# Patient Record
Sex: Female | Born: 1995 | Race: Black or African American | Hispanic: No | Marital: Single | Smoking: Never smoker
Health system: Southern US, Community
[De-identification: ages and names within clinical notes are randomized; demographics above are authoritative.]

## PROBLEM LIST (undated history)

## (undated) ENCOUNTER — Inpatient Hospital Stay (HOSPITAL_COMMUNITY): Payer: Self-pay

## (undated) DIAGNOSIS — J45909 Unspecified asthma, uncomplicated: Secondary | ICD-10-CM

## (undated) DIAGNOSIS — D649 Anemia, unspecified: Secondary | ICD-10-CM

## (undated) HISTORY — PX: TONSILLECTOMY: SUR1361

---
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
## 2015-04-25 ENCOUNTER — Encounter (HOSPITAL_COMMUNITY): Payer: Self-pay | Admitting: *Deleted

## 2016-09-19 IMAGING — US US OB COMP LESS 14 WK
1 series · 14 of 28 positions shown · non-contrast
Comparison: None.

CLINICAL DATA: Pregnant patient with vaginal spotting and cramping
for 2 weeks.

EXAM:
OBSTETRIC <14 WK US AND TRANSVAGINAL OB US
TECHNIQUE: Both transabdominal and transvaginal ultrasound examinations were
performed for complete evaluation of the gestation as well as the
maternal uterus, adnexal regions, and pelvic cul-de-sac.
Transvaginal technique was performed to assess early pregnancy.

[Series 1: us ob comp less 14 wk · 14 of 39 slices shown]
[im 2/39]
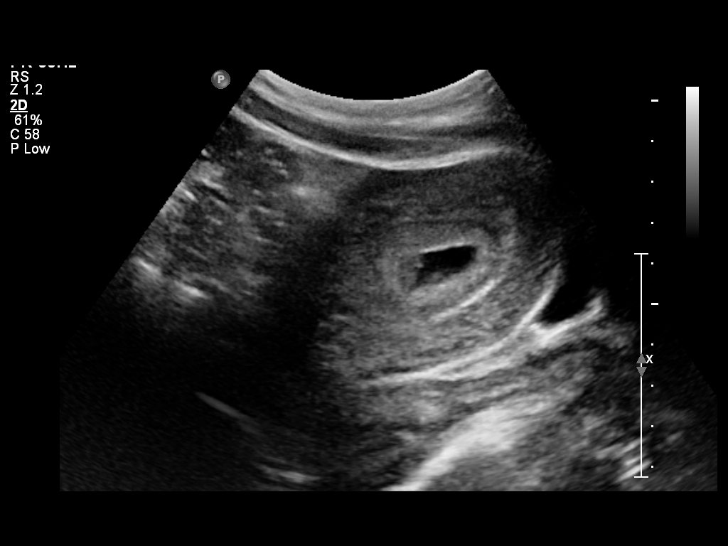
[im 5/39]
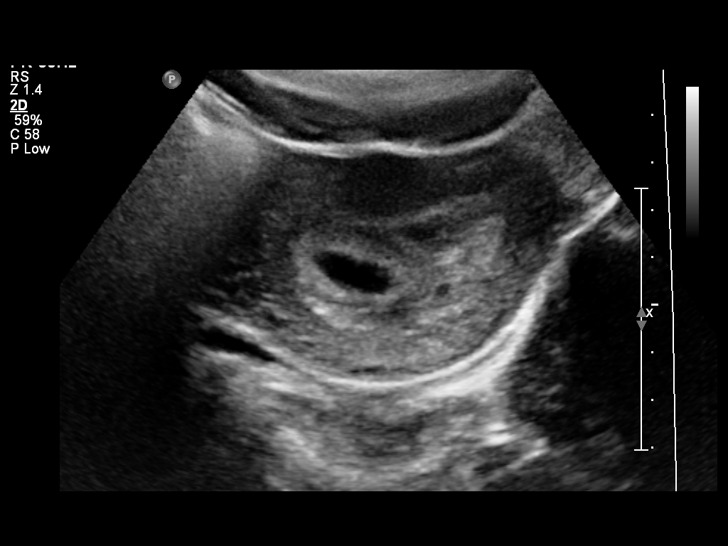
[im 8/39]
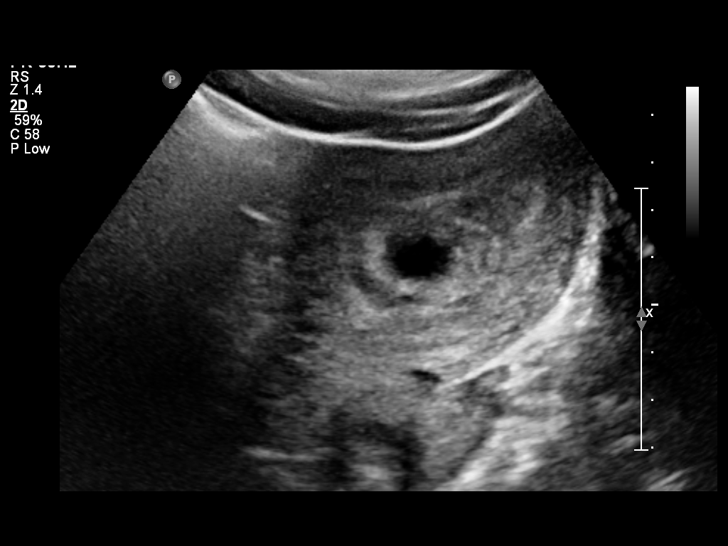
[im 10/39]
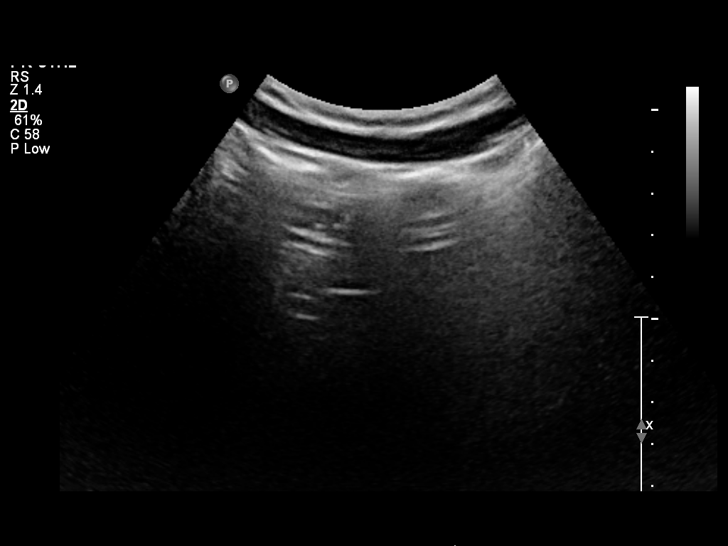
[im 13/39]
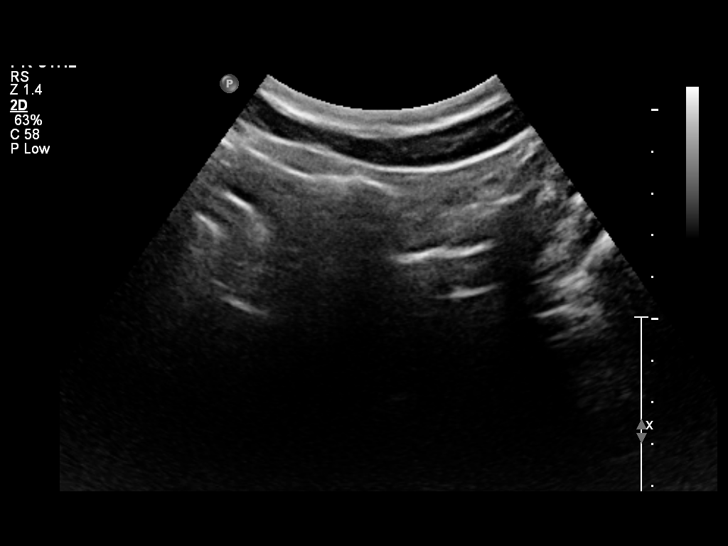
[im 16/39]
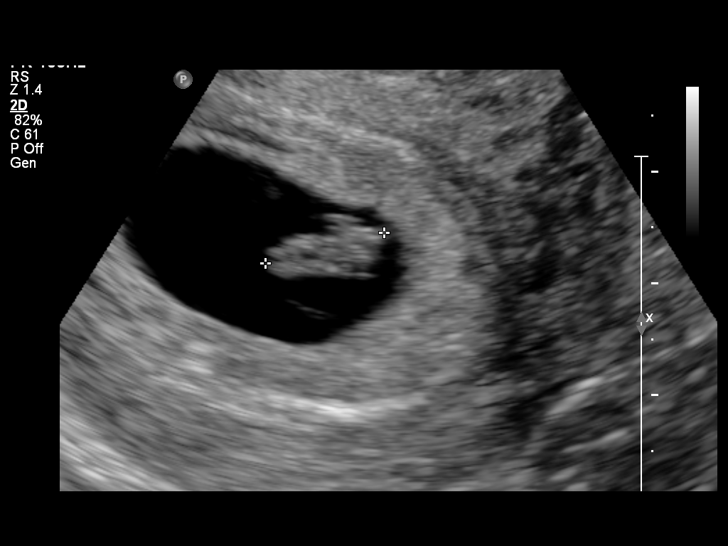
[im 19/39]
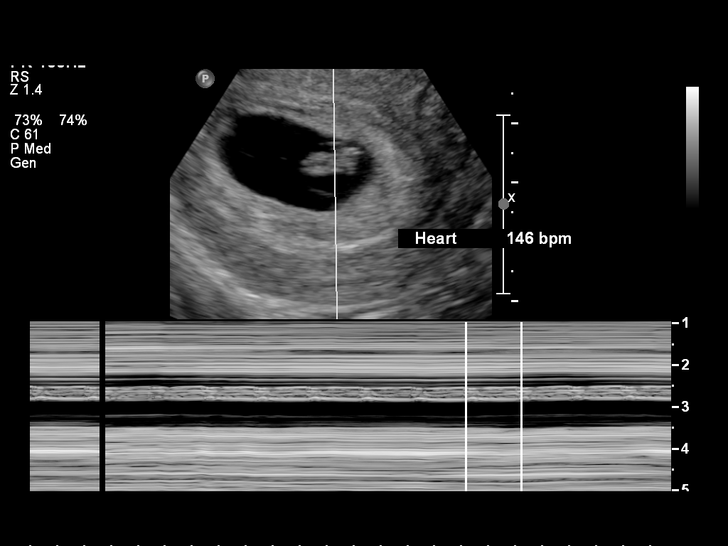
[im 22/39]
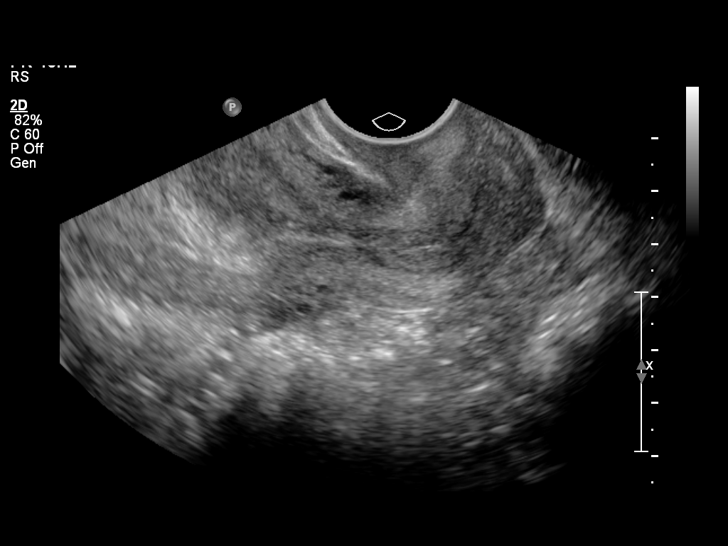
[im 24/39]
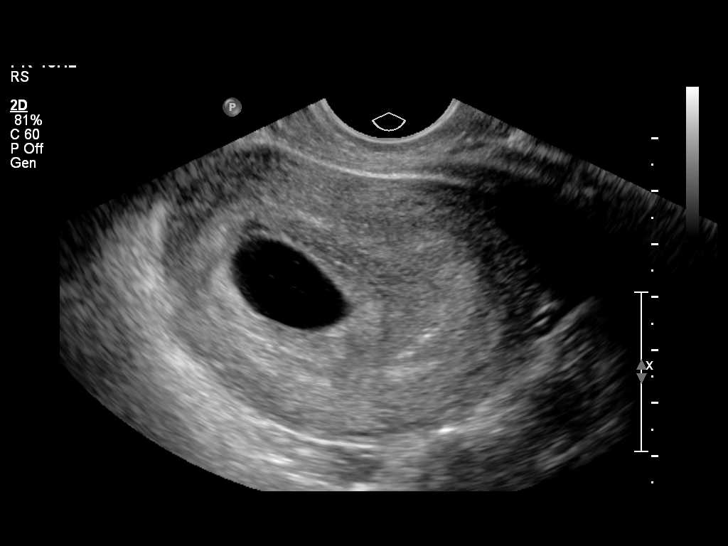
[im 27/39]
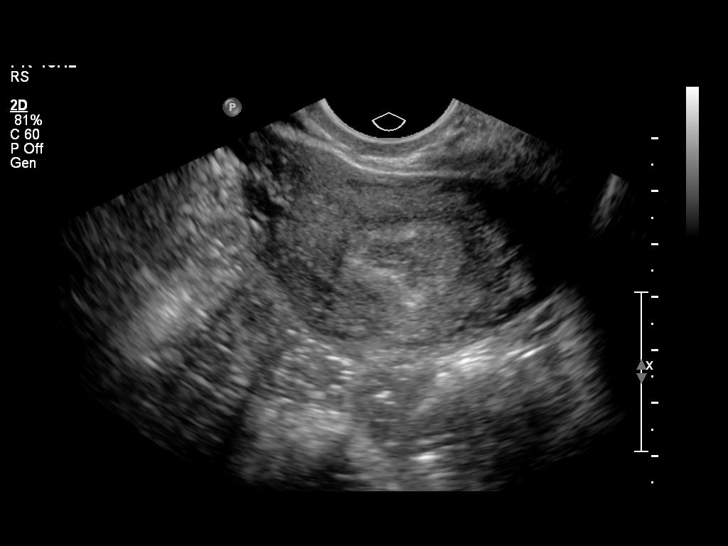
[im 30/39]
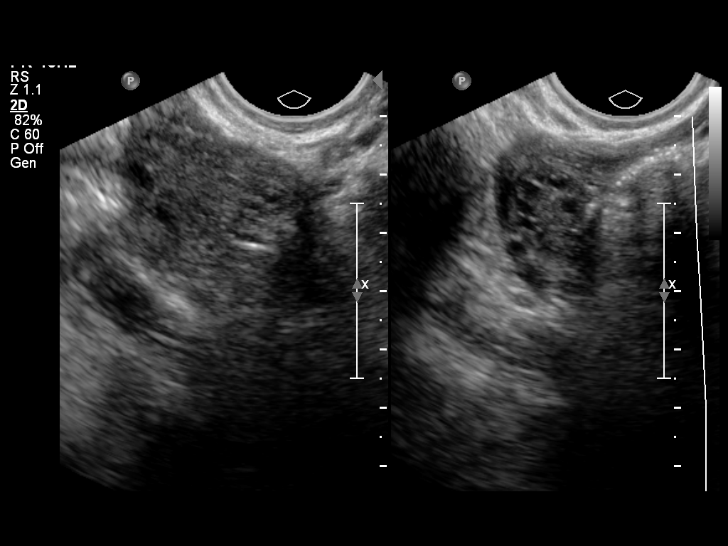
[im 33/39]
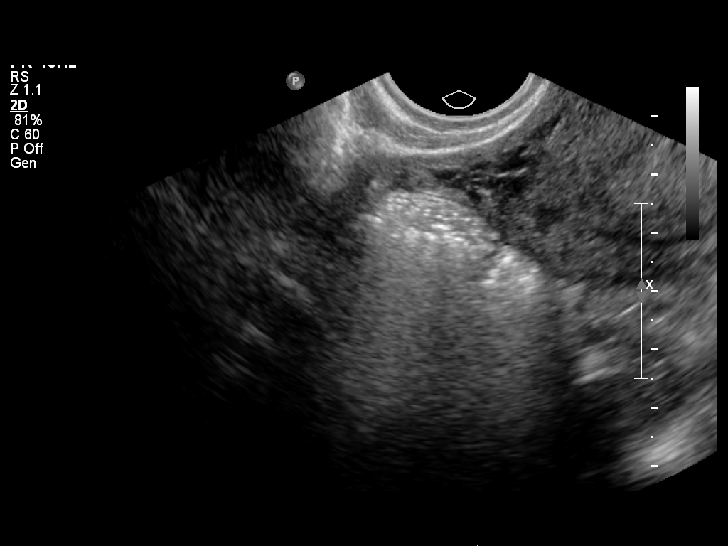
[im 36/39]
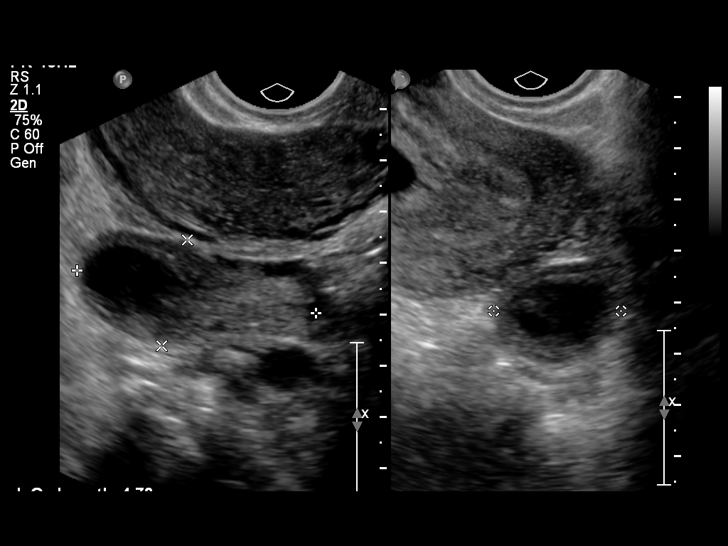
[im 39/39]
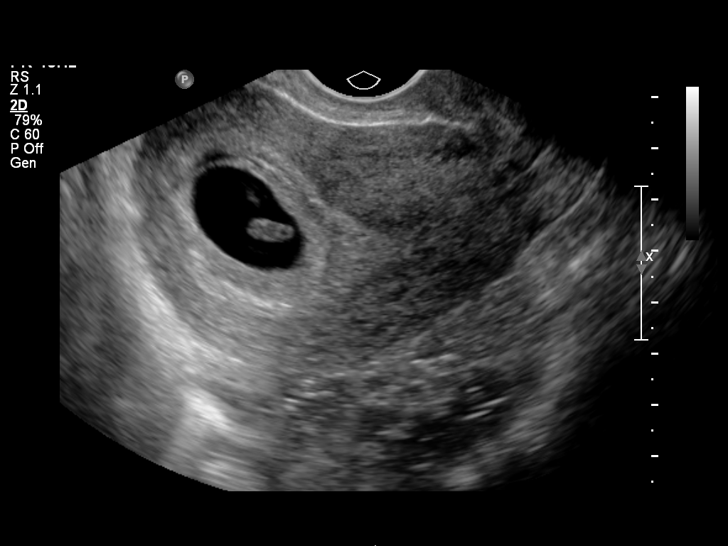

[14 of 28 positions shown; findings below may reference images not displayed]

FINDINGS: Intrauterine gestational sac: Visualized/normal in shape.

Yolk sac:  Present.

Embryo:  Present.

Cardiac Activity: Present.

Heart Rate: 146  bpm

CRL:  11.1  mm   7 w   2 d                  US EDC: 02/04/2015

Maternal uterus/adnexae: No subchorionic hemorrhage. Right ovary
measures 3.1 x 2.3 x 1.5 cm with blood flow and is normal. Left
ovary measures 4.7 x 2.1 x 2.5 cm with blood flow, there is a corpus
luteal cyst. No pelvic free fluid.
IMPRESSION: Single live intrauterine pregnancy estimated gestational age 7 weeks
2 days for estimated date of delivery 02/04/2015.

## 2016-10-27 IMAGING — US US MFM FETAL NUCHAL TRANSLUCENCY
1 series · 13 of 28 positions shown · non-contrast
Comparison: none

[Series 1: us mfm fetal nuchal translucency · 0.07mm/px · 13 of 37 slices shown]
[im 2/37]
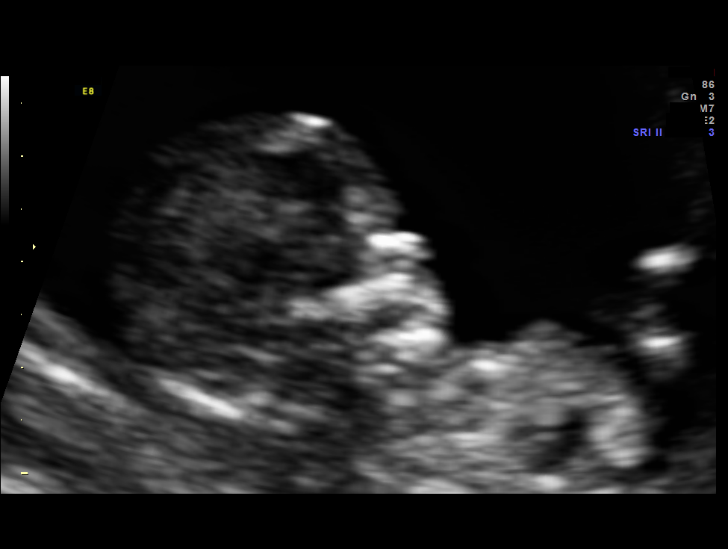
[im 5/37]
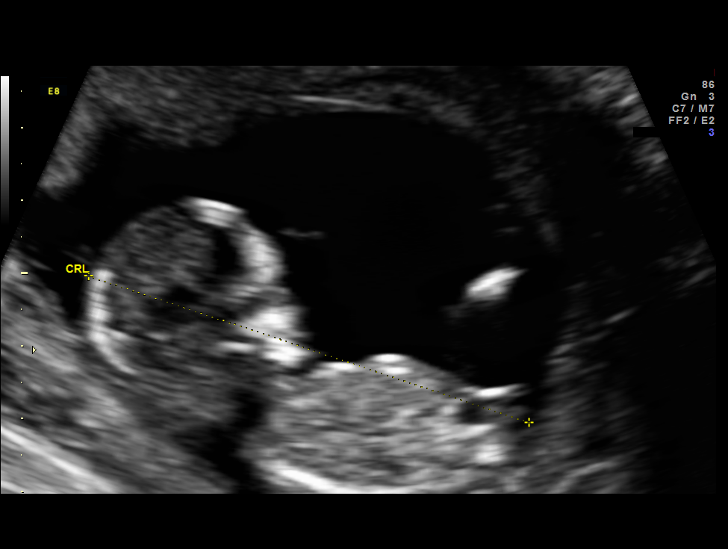
[im 7/37]
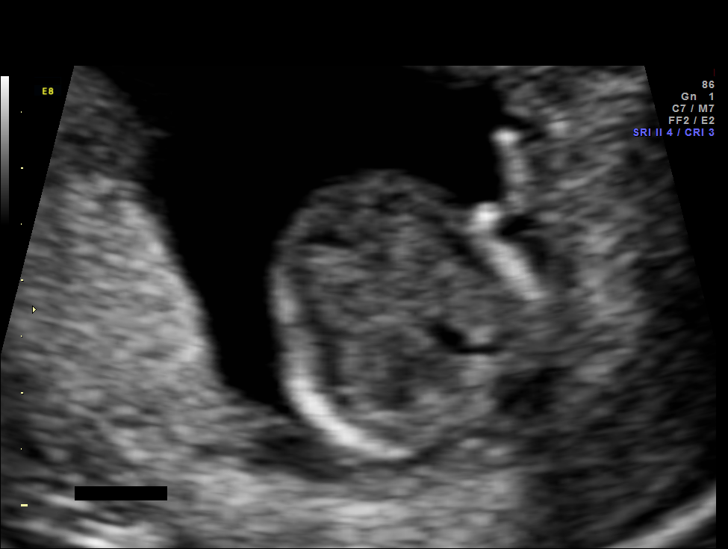
[im 10/37]
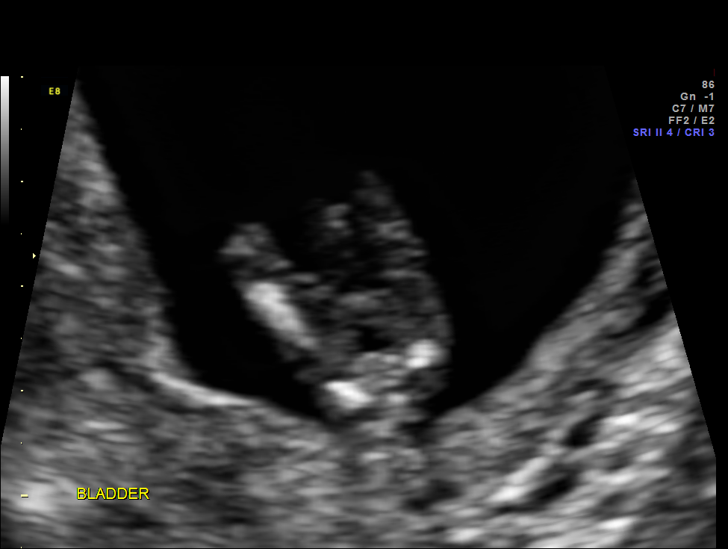
[im 13/37]
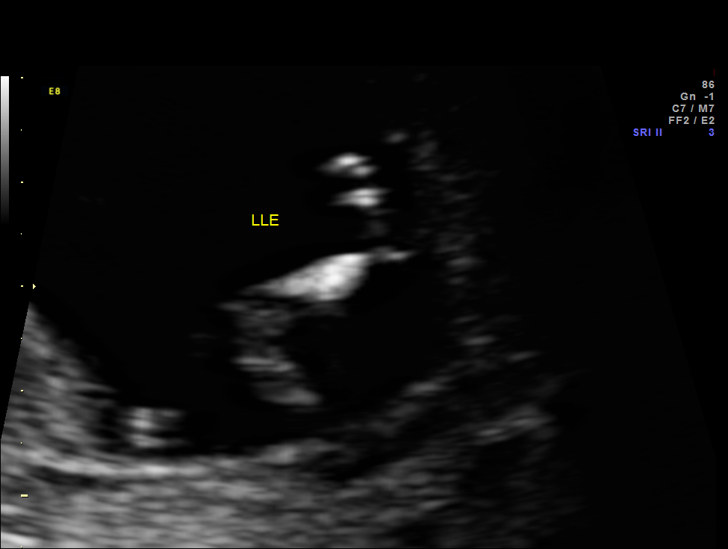
[im 15/37]
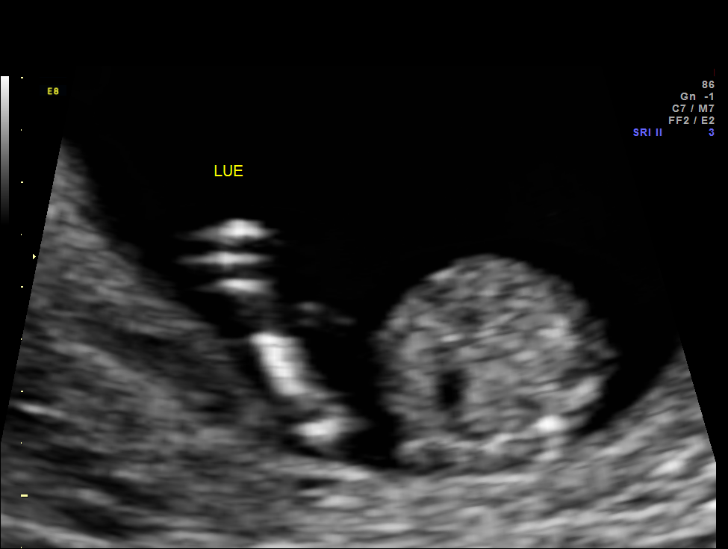
[im 19/37]
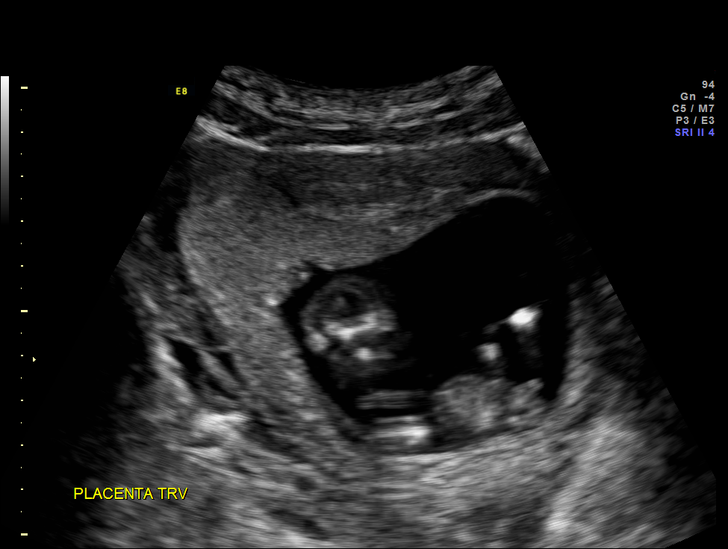
[im 22/37]
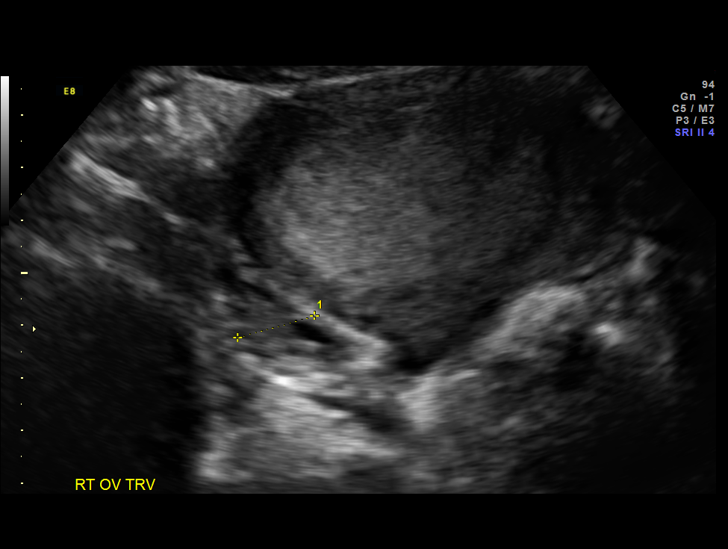
[im 25/37]
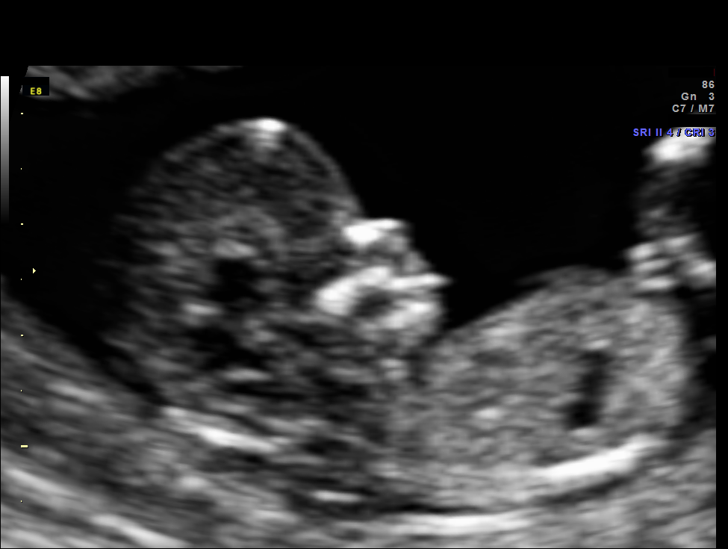
[im 27/37]
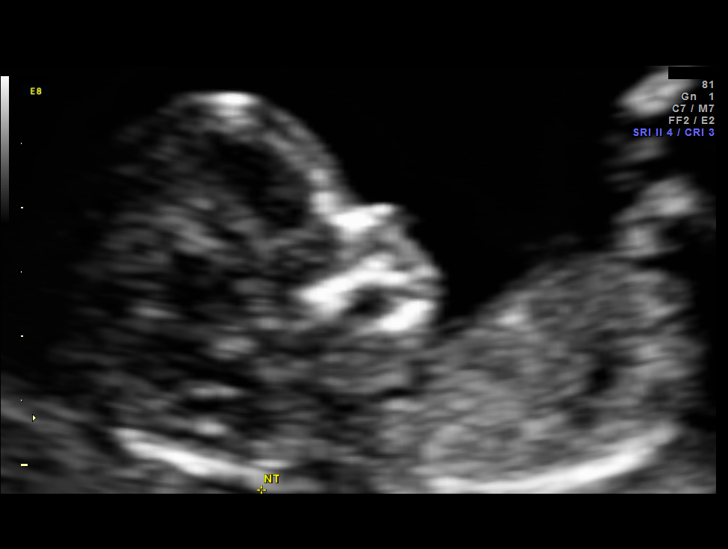
[im 30/37]
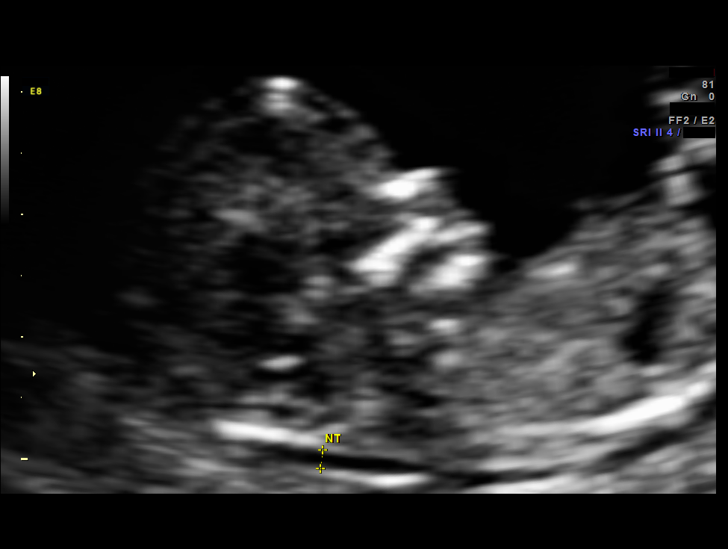
[im 33/37]
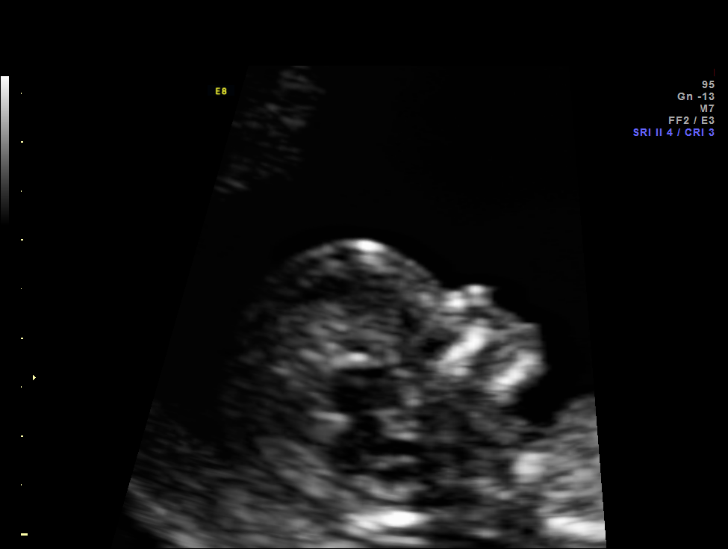
[im 35/37]
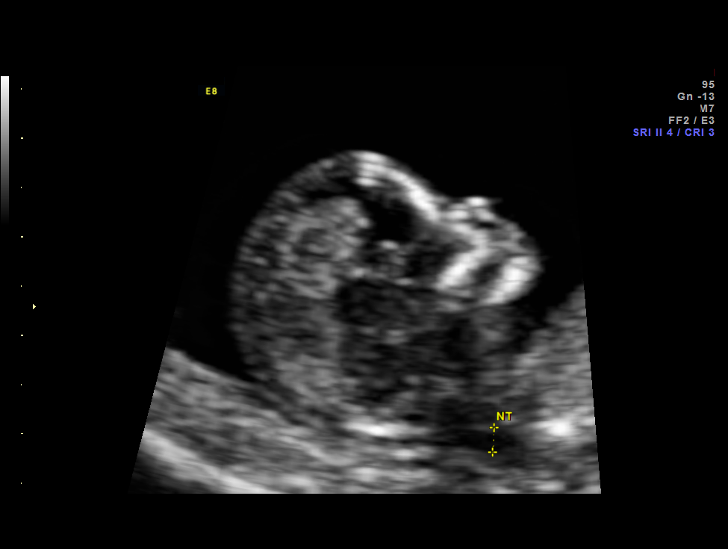

[13 of 28 positions shown; findings below may reference images not displayed]

OBSTETRICS REPORT
(Signed Final 07/28/2014 [DATE])

MARGRET

Service(s) Provided

Indications

12 weeks gestation of pregnancy
First trimester aneuploidy screen (NT)                Z36
Fetal Evaluation

Num Of Fetuses:    1
Preg. Location:    Intrauterine
Yolk Sac:          Visualized
Fetal Heart Rate:  171                          bpm
Cardiac Activity:  Observed
Presentation:      Transverse, head to
maternal right
Placenta:          Posterior right

Amniotic Fluid
AFI FV:      Subjectively within normal limits
Gestational Age

LMP:           12w 0d        Date:  05/05/14                 EDD:   02/09/15
Best:          12w 5d     Det. By:  Early Ultrasound         EDD:   02/04/15
(06/20/14)
1st Trimester Genetic Sonogram Screening

CRL:            64.7  mm    G. Age:   12w 5d                 EDD:   02/04/15
Nuc Trans:       1.7  mm

Nasal Bone:                 Present
Anatomy

Cranium:          Appears normal         Bladder:          Appears normal
Choroid Plexus:   Appears normal         Lower             Visualized
Extremities:
Stomach:          Appears normal, left   Upper             Visualized
sided                  Extremities:
Cord Vessels:     Appears normal (3
vessel cord)
Cervix Uterus Adnexa

Cervix:       Normal appearance by transabdominal scan.

Left Ovary:    Within normal limits.
Right Ovary:   Within normal limits.
Adnexa:     No abnormality visualized.
Impression

SIUP at 12+5 weeks
No gross abnormalities identified
NT measurement was within normal limits for this GA; NB
present
Normal amniotic fluid volume
Measurements consistent with early US
Recommendations

Offer MSAFP in the second trimester for ONTD screening
Offer anatomy U/S by 18 weeks

## 2016-12-08 IMAGING — US US MFM OB COMP +14 WKS
1 series · 13 of 28 positions shown · non-contrast
Comparison: none

[Series 1: us mfm ob comp +14 wks · 63 acquisitions, 13 frames shown]
[im 3/63]
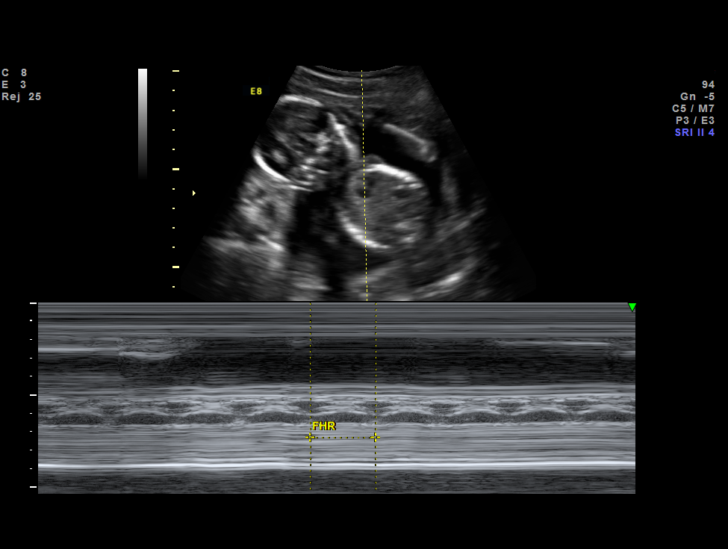
[im 7/63]
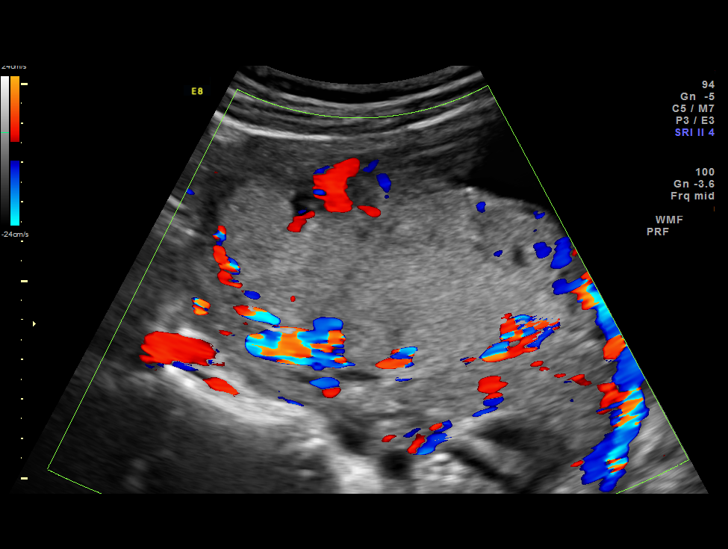
[im 12/63]
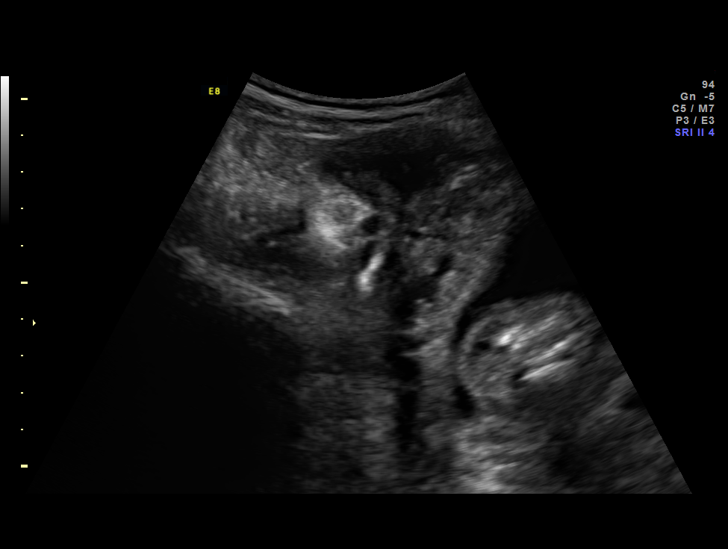
[im 17/63]
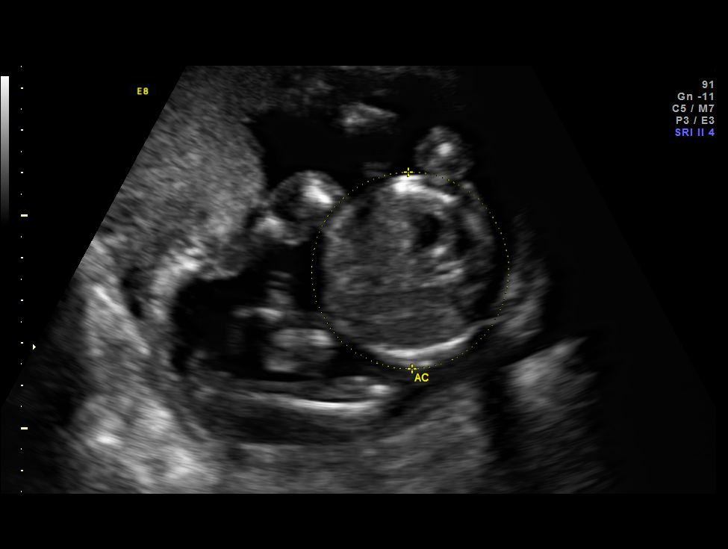
[im 21/63]
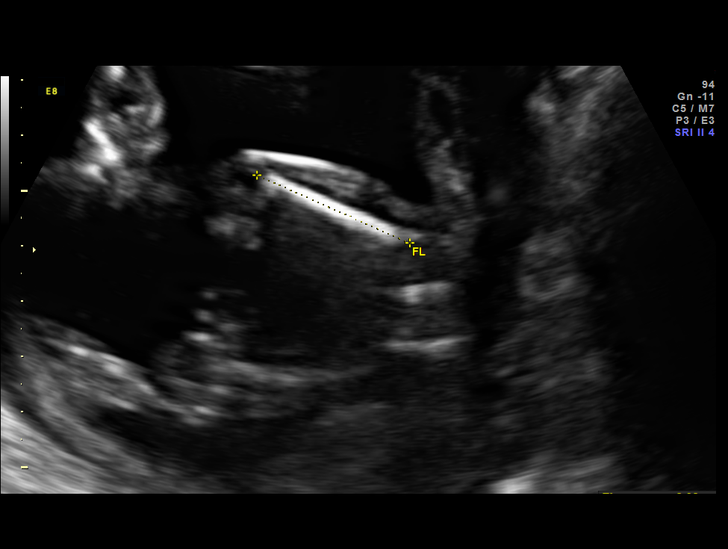
[im 26/63]
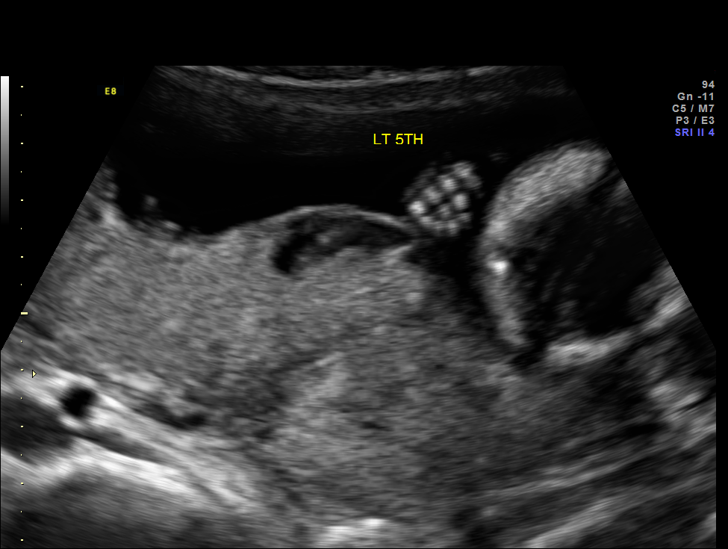
[im 33/63]
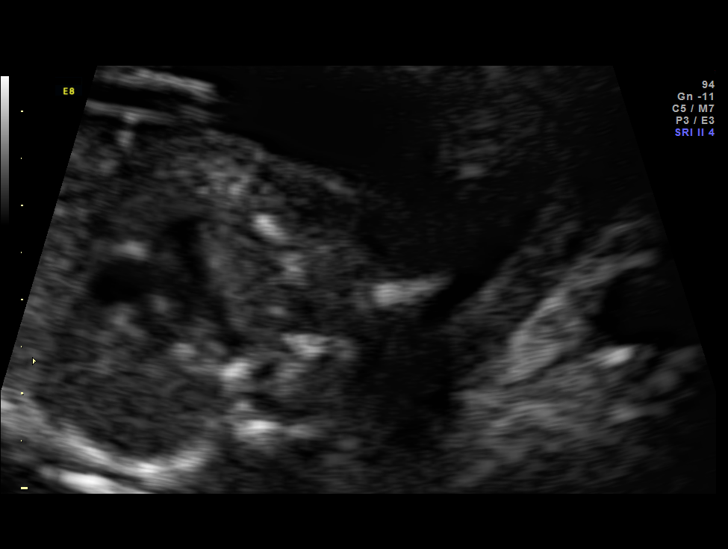
[im 37/63]
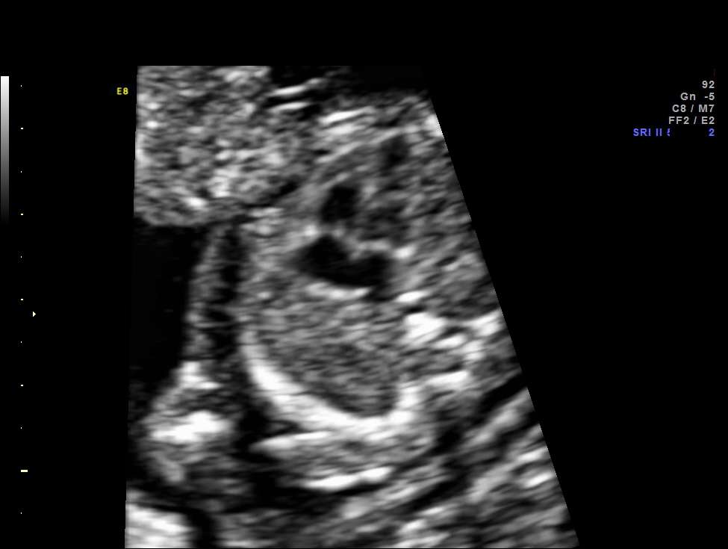
[im 42/63]
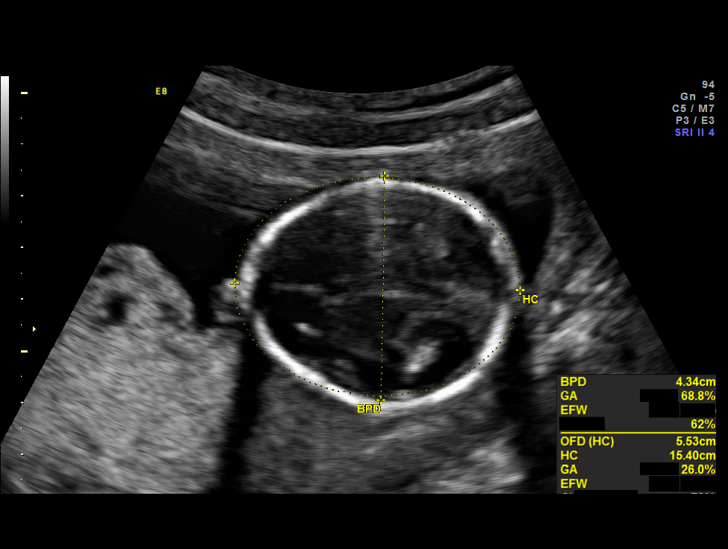
[im 46/63]
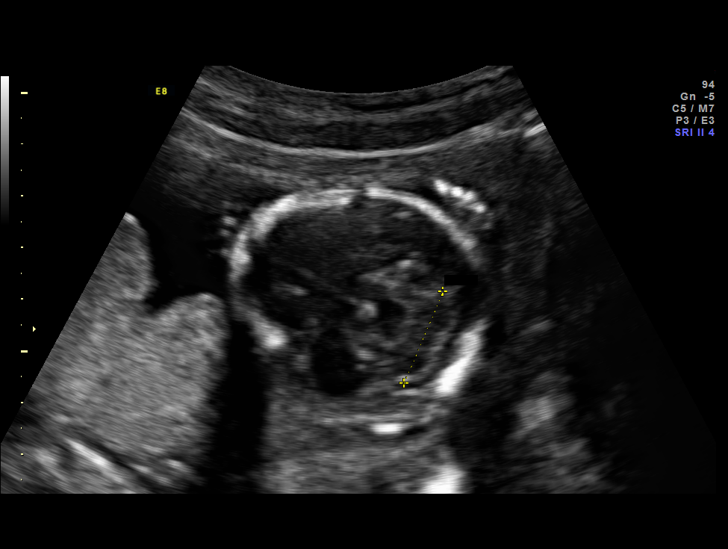
[im 51/63]
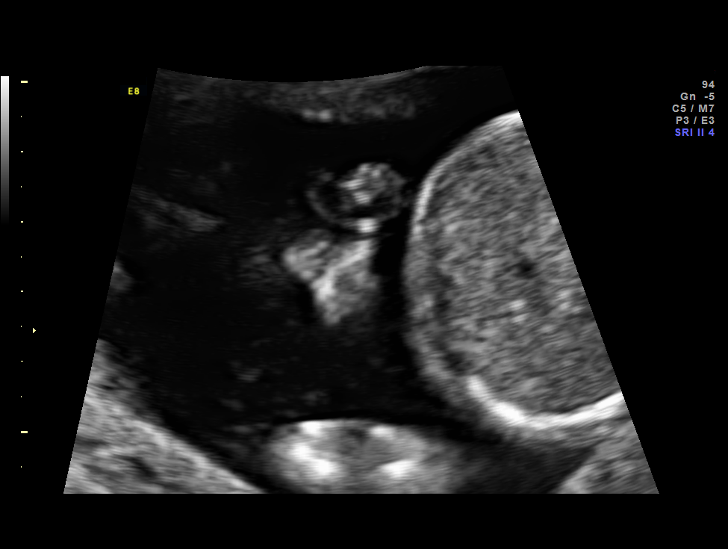
[im 56/63]
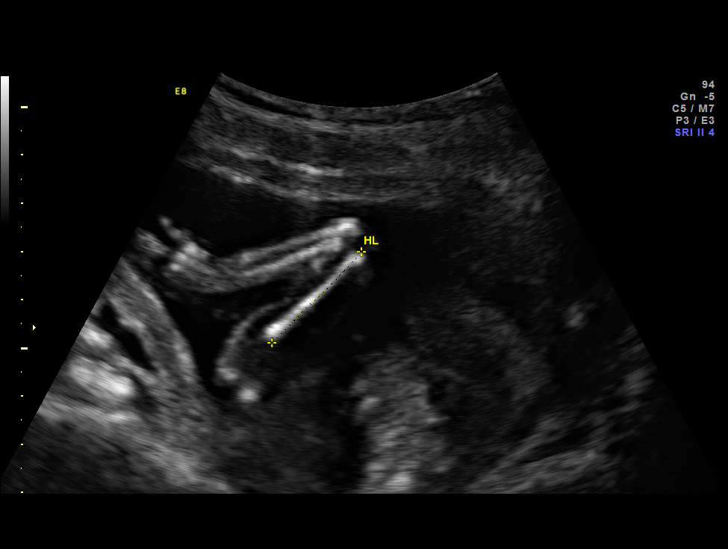
[im 60/63]
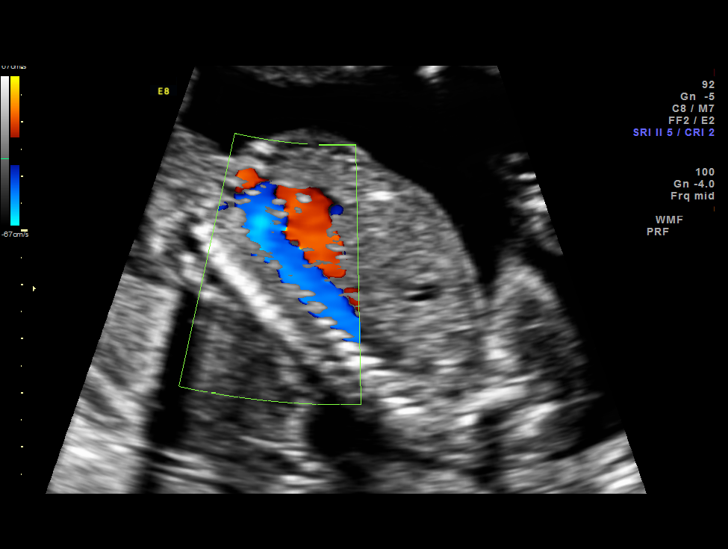

[13 of 28 positions shown; findings below may reference images not displayed]

OBSTETRICS REPORT
(Signed Final 09/08/2014 [DATE])

MSC IR LIONNEL

Service(s) Provided

Indications

18 weeks gestation of pregnancy
Basic anatomic survey                                 Z36
Fetal Evaluation

Num Of Fetuses:    1
Fetal Heart Rate:  153                          bpm
Cardiac Activity:  Observed
Presentation:      Frank breech
Placenta:          Posterior, above cervical
os
P. Cord            Visualized
Insertion:

Amniotic Fluid
AFI FV:      Subjectively within normal limits
Larg Pckt:     3.4  cm
Biometry

BPD:     43.6  mm     G. Age:  19w 1d                CI:         81.0   70 - 86
OFD:     53.8  mm                                    FL/HC:      18.4   16.1 -
18.3
HC:     154.7  mm     G. Age:  18w 3d       29  %    HC/AC:      1.09   1.09 -
1.39
AC:     141.7  mm     G. Age:  19w 4d       73  %    FL/BPD:
FL:      28.5  mm     G. Age:  18w 5d       45  %    FL/AC:      20.1   20 - 24
HUM:       27  mm     G. Age:  18w 4d       49  %
CER:     19.3  mm     G. Age:  18w 5d       48  %

Est. FW:     274  gm    0 lb 10 oz      52  %
Gestational Age

LMP:           18w 0d        Date:  05/05/14                 EDD:   02/09/15
U/S Today:     19w 0d                                        EDD:   02/02/15
Best:          18w 5d     Det. By:  Early Ultrasound         EDD:   02/04/15
(06/20/14)
Anatomy

Cranium:          Appears normal         Aortic Arch:      Not well visualized
Fetal Cavum:      Appears normal         Ductal Arch:      Not well visualized
Ventricles:       Appears normal         Diaphragm:        Not well visualized
Choroid Plexus:   Appears normal         Stomach:          Appears normal, left
sided
Cerebellum:       Appears normal         Abdomen:          Appears normal
Posterior Fossa:  Not well visualized    Abdominal Wall:   Appears nml (cord
insert, abd wall)
Nuchal Fold:      Not well visualized    Cord Vessels:     Appears normal (3
vessel cord)
Face:             Orbits nl; profile not Kidneys:          Not well visualized
well visualized
Lips:             Not well visualized    Bladder:          Appears normal
Heart:            Appears normal         Spine:            Not well visualized
(4CH, axis, and
situs)
RVOT:             Appears normal         Lower             Appears normal
Extremities:
LVOT:             Appears normal         Upper             Appears normal
Extremities:

Other:  Heels and lt 5th digit previously seen. Nasal bone visualized.
Technically difficult due to fetal position.
Cervix Uterus Adnexa

Cervical Length:    2.9      cm

Cervix:       Normal appearance by transabdominal scan.
Impression

SIUP at 79w4d
EFW 52nd%
no dysmorphic features, limited as above
no previa
Recommendations

Interval growth and attempt to complete survey in 6 weeks.

## 2017-03-18 ENCOUNTER — Encounter: Payer: Self-pay | Admitting: *Deleted
